# Patient Record
Sex: Male | Born: 1985 | Race: White | Hispanic: Yes | Marital: Single | Smoking: Current some day smoker
Health system: Southern US, Community
[De-identification: ages and names within clinical notes are randomized; demographics above are authoritative.]

## PROBLEM LIST (undated history)

## (undated) DIAGNOSIS — K859 Acute pancreatitis without necrosis or infection, unspecified: Secondary | ICD-10-CM

---

## 2006-11-12 ENCOUNTER — Emergency Department (HOSPITAL_COMMUNITY): Admission: EM | Admit: 2006-11-12 | Discharge: 2006-11-12 | Payer: Self-pay | Admitting: Family Medicine

## 2007-01-28 ENCOUNTER — Emergency Department (HOSPITAL_COMMUNITY): Admission: EM | Admit: 2007-01-28 | Discharge: 2007-01-28 | Payer: Self-pay | Admitting: Emergency Medicine

## 2007-01-30 ENCOUNTER — Emergency Department (HOSPITAL_COMMUNITY): Admission: EM | Admit: 2007-01-30 | Discharge: 2007-01-31 | Payer: Self-pay | Admitting: Emergency Medicine

## 2007-04-21 ENCOUNTER — Emergency Department (HOSPITAL_COMMUNITY): Admission: EM | Admit: 2007-04-21 | Discharge: 2007-04-22 | Payer: Self-pay | Admitting: Emergency Medicine

## 2008-01-01 ENCOUNTER — Emergency Department (HOSPITAL_COMMUNITY): Admission: EM | Admit: 2008-01-01 | Discharge: 2008-01-01 | Payer: Self-pay | Admitting: Emergency Medicine
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
## 2010-10-10 ENCOUNTER — Inpatient Hospital Stay (INDEPENDENT_AMBULATORY_CARE_PROVIDER_SITE_OTHER)
Admission: RE | Admit: 2010-10-10 | Discharge: 2010-10-10 | Disposition: A | Discharge status: Home / Self Care | Payer: Self-pay | Source: Ambulatory Visit | Attending: Family Medicine | Admitting: Family Medicine

## 2010-10-10 DIAGNOSIS — H9209 Otalgia, unspecified ear: Secondary | ICD-10-CM

## 2010-10-10 DIAGNOSIS — R300 Dysuria: Secondary | ICD-10-CM

## 2010-10-10 DIAGNOSIS — M545 Low back pain: Secondary | ICD-10-CM

## 2010-10-10 LAB — POCT URINALYSIS DIP (DEVICE)
Nitrite: NEGATIVE
Urobilinogen, UA: 0.2 mg/dL (ref 0.0–1.0)
pH: 6.5 (ref 5.0–8.0)

## 2010-10-12 LAB — URINE CULTURE: Culture  Setup Time: 201204060520

## 2011-04-03 LAB — POCT I-STAT, CHEM 8
BUN: 12
Chloride: 108
HCT: 45
Sodium: 143
TCO2: 21

## 2011-04-16 LAB — CBC
HCT: 39.4
Platelets: 251
WBC: 8.5

## 2011-04-16 LAB — DIFFERENTIAL
Basophils Absolute: 0.1
Basophils Relative: 1
Eosinophils Relative: 3
Monocytes Absolute: 0.7
Monocytes Relative: 9

## 2011-04-16 LAB — COMPREHENSIVE METABOLIC PANEL
ALT: 57 — ABNORMAL HIGH
AST: 36
Albumin: 3.8
Alkaline Phosphatase: 62
Chloride: 101
GFR calc Af Amer: >60
Potassium: 4.1
Sodium: 138
Total Bilirubin: 0.7
Total Protein: 6.4

## 2011-04-16 LAB — URINALYSIS, ROUTINE W REFLEX MICROSCOPIC
Glucose, UA: NEGATIVE
Nitrite: NEGATIVE
Protein, ur: NEGATIVE
Urobilinogen, UA: 0.2

## 2011-04-21 LAB — DIFFERENTIAL
Basophils Absolute: 0
Basophils Absolute: 0
Basophils Relative: 1
Eosinophils Absolute: 0.3
Eosinophils Relative: 5
Lymphocytes Relative: 32
Lymphocytes Relative: 38
Lymphs Abs: 2.7
Lymphs Abs: 2.6
Monocytes Absolute: 0.6
Monocytes Relative: 9
Neutro Abs: 4.7
Neutro Abs: 3.5
Neutrophils Relative %: 48

## 2011-04-21 LAB — COMPREHENSIVE METABOLIC PANEL WITH GFR
AST: 21
Albumin: 3.8
Calcium: 8.9
Creatinine, Ser: 0.87
GFR calc Af Amer: >60
GFR calc non Af Amer: >60

## 2011-04-21 LAB — CBC
HCT: 39.4
HCT: 39
Hemoglobin: 13.4
MCHC: 33.4
MCHC: 34.4
MCV: 80.1
MCV: 79.5
Platelets: 277
Platelets: 263
RBC: 4.92
RBC: 4.9
RDW: 13.4
WBC: 8.4
WBC: 7

## 2011-04-21 LAB — COMPREHENSIVE METABOLIC PANEL
ALT: 24
AST: 20
Alkaline Phosphatase: 73
BUN: 9
BUN: 8
CO2: 29
CO2: 28
Calcium: 8.9
Chloride: 105
Chloride: 102
Creatinine, Ser: 0.95
GFR calc Af Amer: >60
GFR calc non Af Amer: >60
Glucose, Bld: 117 — ABNORMAL HIGH
Potassium: 3.8
Sodium: 137
Total Bilirubin: 0.7
Total Bilirubin: 1.3 — ABNORMAL HIGH
Total Protein: 6.6

## 2011-04-21 LAB — URINALYSIS, ROUTINE W REFLEX MICROSCOPIC
Bilirubin Urine: NEGATIVE
Bilirubin Urine: NEGATIVE
Glucose, UA: NEGATIVE
Glucose, UA: NEGATIVE
Hgb urine dipstick: NEGATIVE
Hgb urine dipstick: NEGATIVE
Ketones, ur: NEGATIVE
Nitrite: NEGATIVE
Protein, ur: NEGATIVE
Protein, ur: NEGATIVE
Specific Gravity, Urine: 1.023
Urobilinogen, UA: 0.2
Urobilinogen, UA: 0.2
pH: 6

## 2011-04-21 LAB — LIPASE, BLOOD
Lipase: 18
Lipase: 18

## 2013-07-08 ENCOUNTER — Encounter (HOSPITAL_COMMUNITY): Payer: Self-pay | Admitting: Emergency Medicine

## 2013-07-08 DIAGNOSIS — E860 Dehydration: Secondary | ICD-10-CM | POA: Insufficient documentation

## 2013-07-08 DIAGNOSIS — K2990 Gastroduodenitis, unspecified, without bleeding: Principal | ICD-10-CM

## 2013-07-08 DIAGNOSIS — K2970 Gastritis, unspecified, without bleeding: Secondary | ICD-10-CM | POA: Insufficient documentation

## 2013-07-08 DIAGNOSIS — F17200 Nicotine dependence, unspecified, uncomplicated: Secondary | ICD-10-CM | POA: Insufficient documentation

## 2013-07-08 MED ORDER — ONDANSETRON 4 MG PO TBDP
8.0000 mg | ORAL_TABLET | Freq: Once | ORAL | Status: AC
  Administered 2013-07-08: 8 mg via ORAL
  Filled 2013-07-08: qty 2

## 2013-07-08 MED ORDER — OXYCODONE-ACETAMINOPHEN 5-325 MG PO TABS
1.0000 | ORAL_TABLET | Freq: Once | ORAL | Status: AC
  Administered 2013-07-08: 1 via ORAL
  Filled 2013-07-08: qty 1

## 2013-07-08 NOTE — ED Notes (Signed)
The pt has had generalized abd pain for 3 days  With increasing pain and vomiting tonight with diarrhea

## 2013-07-09 ENCOUNTER — Emergency Department (HOSPITAL_COMMUNITY)
Admission: EM | Admit: 2013-07-09 | Discharge: 2013-07-09 | Disposition: A | Discharge status: Home / Self Care | Payer: Self-pay | Attending: Emergency Medicine | Admitting: Emergency Medicine

## 2013-07-09 DIAGNOSIS — E860 Dehydration: Secondary | ICD-10-CM

## 2013-07-09 DIAGNOSIS — K2970 Gastritis, unspecified, without bleeding: Secondary | ICD-10-CM

## 2013-07-09 HISTORY — DX: Acute pancreatitis without necrosis or infection, unspecified: K85.90

## 2013-07-09 LAB — URINE MICROSCOPIC-ADD ON

## 2013-07-09 LAB — COMPREHENSIVE METABOLIC PANEL
ALT: 34 U/L (ref 0–53)
AST: 20 U/L (ref 0–37)
Albumin: 4.1 g/dL (ref 3.5–5.2)
Alkaline Phosphatase: 69 U/L (ref 39–117)
BUN: 14 mg/dL (ref 6–23)
CALCIUM: 9.2 mg/dL (ref 8.4–10.5)
CO2: 28 meq/L (ref 19–32)
Chloride: 99 mEq/L (ref 96–112)
Creatinine, Ser: 0.79 mg/dL (ref 0.50–1.35)
GFR calc Af Amer: >90 mL/min (ref >90)
GFR calc non Af Amer: >90 mL/min (ref >90)
Glucose, Bld: 116 mg/dL — ABNORMAL HIGH (ref 70–99)
Potassium: 3.8 mEq/L (ref 3.7–5.3)
SODIUM: 141 meq/L (ref 137–147)
TOTAL PROTEIN: 7.9 g/dL (ref 6.0–8.3)
Total Bilirubin: 0.4 mg/dL (ref 0.3–1.2)

## 2013-07-09 LAB — CBC WITH DIFFERENTIAL/PLATELET
BASOS ABS: 0 10*3/uL (ref 0.0–0.1)
Basophils Relative: 0 % (ref 0–1)
EOS ABS: 0.2 10*3/uL (ref 0.0–0.7)
EOS PCT: 2 % (ref 0–5)
HCT: 44.3 % (ref 39.0–52.0)
Hemoglobin: 15.2 g/dL (ref 13.0–17.0)
LYMPHS PCT: 39 % (ref 12–46)
Lymphs Abs: 3.8 10*3/uL (ref 0.7–4.0)
MCH: 27 pg (ref 26.0–34.0)
MCHC: 34.3 g/dL (ref 30.0–36.0)
MCV: 78.8 fL (ref 78.0–100.0)
Monocytes Absolute: 0.6 10*3/uL (ref 0.1–1.0)
Monocytes Relative: 7 % (ref 3–12)
NEUTROS PCT: 52 % (ref 43–77)
Neutro Abs: 5 10*3/uL (ref 1.7–7.7)
PLATELETS: 304 10*3/uL (ref 150–400)
RBC: 5.62 MIL/uL (ref 4.22–5.81)
RDW: 12.9 % (ref 11.5–15.5)
WBC: 9.6 10*3/uL (ref 4.0–10.5)

## 2013-07-09 LAB — URINALYSIS, ROUTINE W REFLEX MICROSCOPIC
Bilirubin Urine: NEGATIVE
GLUCOSE, UA: NEGATIVE mg/dL
HGB URINE DIPSTICK: NEGATIVE
Ketones, ur: 15 mg/dL — AB
Leukocytes, UA: NEGATIVE
Nitrite: NEGATIVE
Protein, ur: 30 mg/dL — AB
SPECIFIC GRAVITY, URINE: 1.026 (ref 1.005–1.030)
Urobilinogen, UA: 1 mg/dL (ref 0.0–1.0)
pH: 7 (ref 5.0–8.0)

## 2013-07-09 LAB — LIPASE, BLOOD: Lipase: 45 U/L (ref 11–59)

## 2013-07-09 MED ORDER — SODIUM CHLORIDE 0.9 % IV BOLUS (SEPSIS)
1000.0000 mL | Freq: Once | INTRAVENOUS | Status: AC
  Administered 2013-07-09: 1000 mL via INTRAVENOUS

## 2013-07-09 MED ORDER — OMEPRAZOLE 20 MG PO CPDR: 20.0000 mg | DELAYED_RELEASE_CAPSULE | Freq: Every day | ORAL | Status: AC

## 2013-07-09 MED ORDER — FAMOTIDINE 20 MG PO TABS: 20.0000 mg | ORAL_TABLET | Freq: Two times a day (BID) | ORAL | Status: AC

## 2013-07-09 MED ORDER — MORPHINE SULFATE 4 MG/ML IJ SOLN
4.0000 mg | Freq: Once | INTRAMUSCULAR | Status: AC
  Administered 2013-07-09: 4 mg via INTRAVENOUS
  Filled 2013-07-09: qty 1

## 2013-07-09 MED ORDER — FAMOTIDINE IN NACL 20-0.9 MG/50ML-% IV SOLN
20.0000 mg | Freq: Once | INTRAVENOUS | Status: AC
  Administered 2013-07-09: 20 mg via INTRAVENOUS
  Filled 2013-07-09: qty 50

## 2013-07-09 MED ORDER — ONDANSETRON HCL 4 MG/2ML IJ SOLN
4.0000 mg | Freq: Once | INTRAMUSCULAR | Status: AC
  Administered 2013-07-09: 4 mg via INTRAVENOUS
  Filled 2013-07-09: qty 2

## 2013-07-09 NOTE — Discharge Instructions (Signed)
Stay hydrated.   Take tylenol for pain.   Take pepcid and prilosec as prescribed.   Follow up with your doctor.   Return to ER if you have severe pain, vomiting, dehydration.

## 2013-07-09 NOTE — ED Notes (Signed)
Pt requested water to drink. ok'd by Dr Silverio LayYao who suggested crackers also. Pt refused the crackers. Pt given water.

## 2013-07-09 NOTE — ED Provider Notes (Signed)
CSN: [id number]     Arrival date & time 07/08/13  2327 History   First MD Initiated Contact with Patient 07/09/13 0129     Chief Complaint  Patient presents with  . Abdominal Pain   (Consider location/radiation/quality/duration/timing/severity/associated sxs/prior Treatment) The history is provided by the patient.  Jake Terry is a 28 y.o. male hx of pancreatitis, reflux, gastritis, here with vomiting and abdominal pain. Generalized abdominal pain for last 3 days. Some nausea and vomiting today. Also some diarrhea as well. Denies any fevers or chills. As per the patient he frequently has these symptoms.    Past Medical History  Diagnosis Date  . Pancreatitis    History reviewed. No pertinent past surgical history. No family history on file. History  Substance Use Topics  . Smoking status: Current Every Day Smoker  . Smokeless tobacco: Not on file  . Alcohol Use: Yes    Review of Systems  Gastrointestinal: Positive for vomiting and abdominal pain.  All other systems reviewed and are negative.    Allergies  Review of patient's allergies indicates no known allergies.  Home Medications  No current outpatient prescriptions on file. BP 134/89  Pulse 80  Temp(Src) 98.3 F (36.8 C) (Oral)  Resp 20  Wt 198 lb 8 oz (90.039 kg)  SpO2 99% Physical Exam  Nursing note and vitals reviewed. Constitutional: He is oriented to person, place, and time. He appears well-developed and well-nourished.  HENT:  Head: Normocephalic.  MM slightly dry   Eyes: Conjunctivae are normal. Pupils are equal, round, and reactive to light.  Neck: Normal range of motion. Neck supple.  Cardiovascular: Normal rate, regular rhythm and normal heart sounds.   Pulmonary/Chest: Effort normal and breath sounds normal. No respiratory distress. He has no wheezes. He has no rales.  Abdominal: Soft. Bowel sounds are normal.  Mild epigastric tenderness, no RUQ tenderness   Musculoskeletal: Normal range of  motion.  Neurological: He is alert and oriented to person, place, and time.  Skin: Skin is warm and dry.  Psychiatric: He has a normal mood and affect. His behavior is normal. Judgment and thought content normal.    ED Course  Procedures (including critical care time) Labs Review Labs Reviewed  COMPREHENSIVE METABOLIC PANEL - Abnormal; Notable for the following:    Glucose, Bld 116 (*)    All other components within normal limits  URINALYSIS, ROUTINE W REFLEX MICROSCOPIC - Abnormal; Notable for the following:    Ketones, ur 15 (*)    Protein, ur 30 (*)    All other components within normal limits  CBC WITH DIFFERENTIAL  LIPASE, BLOOD  URINE MICROSCOPIC-ADD ON   Imaging Review No results found.  EKG Interpretation   None       MDM  No diagnosis found. Jake Terry is a 28 y.o. male here with ab pain, vomiting. Slightly dehydrated. LFTs nl, lipase nl. Patient hydrated in the ED and given pepcid. Likely worsening gastritis. Will d/c home on prilosec and pepcid and zofran.      Richardean Canalavid H Yao, MD 07/09/13 [phone]

## 2018-12-21 ENCOUNTER — Emergency Department (HOSPITAL_COMMUNITY)
Admission: EM | Admit: 2018-12-21 | Discharge: 2018-12-21 | Disposition: A | Discharge status: Home / Self Care | Payer: Self-pay | Attending: Emergency Medicine | Admitting: Emergency Medicine

## 2018-12-21 ENCOUNTER — Encounter (HOSPITAL_COMMUNITY): Payer: Self-pay

## 2018-12-21 ENCOUNTER — Emergency Department (HOSPITAL_COMMUNITY): Payer: Self-pay

## 2018-12-21 ENCOUNTER — Encounter (HOSPITAL_COMMUNITY): Payer: Self-pay | Admitting: Emergency Medicine

## 2018-12-21 ENCOUNTER — Other Ambulatory Visit: Payer: Self-pay

## 2018-12-21 ENCOUNTER — Ambulatory Visit (HOSPITAL_COMMUNITY)
Admission: EM | Admit: 2018-12-21 | Discharge: 2018-12-21 | Disposition: A | Discharge status: Home / Self Care | Payer: Self-pay

## 2018-12-21 DIAGNOSIS — R110 Nausea: Secondary | ICD-10-CM | POA: Insufficient documentation

## 2018-12-21 DIAGNOSIS — F17210 Nicotine dependence, cigarettes, uncomplicated: Secondary | ICD-10-CM | POA: Insufficient documentation

## 2018-12-21 DIAGNOSIS — Z79899 Other long term (current) drug therapy: Secondary | ICD-10-CM | POA: Insufficient documentation

## 2018-12-21 DIAGNOSIS — K861 Other chronic pancreatitis: Secondary | ICD-10-CM | POA: Insufficient documentation

## 2018-12-21 DIAGNOSIS — R1011 Right upper quadrant pain: Secondary | ICD-10-CM

## 2018-12-21 LAB — CBC WITH DIFFERENTIAL/PLATELET
Abs Immature Granulocytes: 0.02 10*3/uL (ref 0.00–0.07)
Basophils Absolute: 0 10*3/uL (ref 0.0–0.1)
Basophils Relative: 1 %
Eosinophils Absolute: 0.1 10*3/uL (ref 0.0–0.5)
Eosinophils Relative: 2 %
HCT: 41.9 % (ref 39.0–52.0)
Hemoglobin: 13.7 g/dL (ref 13.0–17.0)
Immature Granulocytes: 0 %
Lymphocytes Relative: 29 %
Lymphs Abs: 2 10*3/uL (ref 0.7–4.0)
MCH: 26.8 pg (ref 26.0–34.0)
MCHC: 32.7 g/dL (ref 30.0–36.0)
MCV: 81.8 fL (ref 80.0–100.0)
Monocytes Absolute: 0.6 10*3/uL (ref 0.1–1.0)
Monocytes Relative: 9 %
Neutro Abs: 4.1 10*3/uL (ref 1.7–7.7)
Neutrophils Relative %: 59 %
Platelets: 249 10*3/uL (ref 150–400)
RBC: 5.12 MIL/uL (ref 4.22–5.81)
RDW: 13.3 % (ref 11.5–15.5)
WBC: 7 10*3/uL (ref 4.0–10.5)
nRBC: 0 % (ref 0.0–0.2)

## 2018-12-21 LAB — COMPREHENSIVE METABOLIC PANEL
ALT: 21 U/L (ref 0–44)
AST: 15 U/L (ref 15–41)
Albumin: 3.9 g/dL (ref 3.5–5.0)
Alkaline Phosphatase: 53 U/L (ref 38–126)
Anion gap: 11 (ref 5–15)
BUN: 13 mg/dL (ref 6–20)
CO2: 23 mmol/L (ref 22–32)
Calcium: 9.4 mg/dL (ref 8.9–10.3)
Chloride: 104 mmol/L (ref 98–111)
Creatinine, Ser: 0.92 mg/dL (ref 0.61–1.24)
GFR calc Af Amer: >60 mL/min (ref >60)
GFR calc non Af Amer: >60 mL/min (ref >60)
Glucose, Bld: 157 mg/dL — ABNORMAL HIGH (ref 70–99)
Potassium: 4 mmol/L (ref 3.5–5.1)
Sodium: 138 mmol/L (ref 135–145)
Total Bilirubin: 0.6 mg/dL (ref 0.3–1.2)
Total Protein: 6.9 g/dL (ref 6.5–8.1)

## 2018-12-21 LAB — LIPASE, BLOOD: Lipase: 63 U/L — ABNORMAL HIGH (ref 11–51)

## 2018-12-21 MED ORDER — MORPHINE SULFATE (PF) 4 MG/ML IV SOLN
4.0000 mg | Freq: Once | INTRAVENOUS | Status: AC
  Administered 2018-12-21: 4 mg via INTRAVENOUS
  Filled 2018-12-21: qty 1

## 2018-12-21 MED ORDER — ONDANSETRON HCL 4 MG/2ML IJ SOLN
4.0000 mg | Freq: Once | INTRAMUSCULAR | Status: AC
  Administered 2018-12-21: 4 mg via INTRAVENOUS
  Filled 2018-12-21: qty 2

## 2018-12-21 NOTE — Discharge Instructions (Addendum)
33 y.o. male with h/o GERD here for RUQ pain x 5 days.   Denies h/o cholecystectomy, liver disease. Has chronic, intermittent h/o biliary colic, though now this has become constant over the last 5 days. Patient is hemodynamically stable, afebrile, and w/o jaundice, though in severe pain in RUQ w/ positive Murphy's sign. Referred patient to ER for further evaluation for suspected cholecystitis (RUQ [place]).

## 2018-12-21 NOTE — ED Provider Notes (Signed)
[facility] Provider Note   CSN: [id number] Arrival date & time: 12/21/18  1239     History   Chief Complaint Chief Complaint  Patient presents with  . Abdominal Pain    HPI Han P Ethelle LyonOcampo is a 33 y.o. male.     HPI  33 year old male with a PMH of pancreatitis, presents with right upper quadrant pain for the last 5 days.  He does note approximately 15 days ago he had 6 beers but has not had anything to drink since then.  He notes pain is worse after eating foods.  He notes some mild nausea.  He denies vomiting, diarrhea, constipation, fevers, chills.  Denies any chest pain or shortness of breath.  He denies a history of abdominal surgeries.  Past Medical History:  Diagnosis Date  . Pancreatitis     There are no active problems to display for this patient.   History reviewed. No pertinent surgical history.      Home Medications    Prior to Admission medications   Medication Sig Start Date End Date Taking? Authorizing Provider  famotidine (PEPCID) 20 MG tablet Take 1 tablet (20 mg total) by mouth 2 (two) times daily. 07/09/13   Charlynne PanderYao, David Hsienta, MD  omeprazole (PRILOSEC) 20 MG capsule Take 1 capsule (20 mg total) by mouth daily. 07/09/13   Charlynne PanderYao, David Hsienta, MD    Family History Family History  Problem Relation Age of Onset  . Healthy Mother   . Healthy Father     Social History Social History   Tobacco Use  . Smoking status: Current Some Day Smoker    Types: Cigarettes  . Smokeless tobacco: Never Used  Substance Use Topics  . Alcohol use: Yes  . Drug use: Not on file     Allergies   Patient has no known allergies.   Review of Systems Review of Systems  Constitutional: Negative for chills and fever.  Respiratory: Negative for shortness of breath.   Cardiovascular: Negative for chest pain.  Gastrointestinal: Positive for abdominal pain and nausea. Negative for vomiting.  Genitourinary: Negative for  dysuria.  All other systems reviewed and are negative.    Physical Exam Updated Vital Signs BP (!) 142/86 (BP Location: Right Arm)   Pulse 72   Temp 98.7 F (37.1 C) (Oral)   Resp 16   SpO2 99%   Physical Exam Vitals signs and nursing note reviewed.  Constitutional:      Appearance: He is well-developed.  HENT:     Head: Normocephalic and atraumatic.  Eyes:     Conjunctiva/sclera: Conjunctivae normal.  Neck:     Musculoskeletal: Neck supple.  Cardiovascular:     Rate and Rhythm: Normal rate and regular rhythm.     Heart sounds: Normal heart sounds. No murmur.  Pulmonary:     Effort: Pulmonary effort is normal. No respiratory distress.     Breath sounds: Normal breath sounds. No wheezing or rales.  Abdominal:     General: Bowel sounds are normal. There is no distension.     Palpations: Abdomen is soft.     Tenderness: There is abdominal tenderness in the right upper quadrant. There is no guarding or rebound. Positive signs include Murphy's sign. Negative signs include McBurney's sign.  Musculoskeletal: Normal range of motion.        General: No tenderness or deformity.  Skin:    General: Skin is warm and dry.     Findings: No erythema or  rash.  Neurological:     Mental Status: He is alert and oriented to person, place, and time.  Psychiatric:        Behavior: Behavior normal.      ED Treatments / Results  Labs (all labs ordered are listed, but only abnormal results are displayed) Labs Reviewed  CBC WITH DIFFERENTIAL/PLATELET  COMPREHENSIVE METABOLIC PANEL  LIPASE, BLOOD    EKG    Radiology No results found.  Procedures Procedures (including critical care time)  Medications Ordered in ED Medications  morphine 4 MG/ML injection 4 mg (has no administration in time range)  ondansetron (ZOFRAN) injection 4 mg (has no administration in time range)     Initial Impression / Assessment and Plan / ED Course  I have reviewed the triage vital signs and the  nursing notes.  Pertinent labs & imaging results that were available during my care of the patient were reviewed by me and considered in my medical decision making (see chart for details).        Presented with right upper quadrant pain he has had this intermittently for 5 days, worse with eating food.  He has a history of pancreatitis.  He denies epigastric pain.  He denies any vomiting.  He was given morphine and Zofran.  He had a right upper quadrant ultrasound which shows no evidence of acute cholecystitis.  His blood work is reassuring, no elevation in the white count.  His kidney function is stable, no significant electrolyte abnormality.  Lipase is slightly elevated however do not feel that this is pancreatitis as it is only minimally elevated.  On reevaluation patient has no epigastric pain.  He is feeling significantly better.  History and physical most consistent with biliary colic.  Patient encouraged to follow-up with primary care for general surgeon.  He did have fatty liver on ultrasound but LFTs are normal, patient is not jaundiced.  Now resting comfortably in bed, no acute distress, nontoxic, non-lethargic.  Vital signs stable.  Given strict return precautions and expressed understanding.  He is ready and stable for discharge.  Final Clinical Impressions(s) / ED Diagnoses   Final diagnoses:  RUQ pain    ED Discharge Orders    None       Rachel Moulds 12/21/18 2006    Daleen Bo, MD 12/22/18 [phone]

## 2018-12-21 NOTE — ED Notes (Signed)
Pt. Transported to [place]

## 2018-12-21 NOTE — ED Provider Notes (Signed)
[facility]    CSN: [id number] Arrival date & time: 12/21/18  1058     History   Chief Complaint Chief Complaint  Patient presents with  . Abdominal Pain    HPI Thamas P Ethelle LyonOcampo is a 33 y.o. male with a history of pancreatitis presenting for right upper quadrant pain.  Patient states that he has had "a couple years now" where he has had intermittent right-sided pain with associated nausea.  Patient is unable to articulate what types of foods exacerbate this.  Patient feels that it is "almost anything to eat ".  Patient states over the last 5 days his pain has turned from intermittent colicky to constant.  Patient states that he has had some nausea, no vomiting or diarrhea.  Patient does admit to alcohol use, "only on the weekends "and denies recent binge drinking.  Patient denies history of abdominal surgeries.  States that the only thing helps with his pain is Tylenol: Has been taking 1 tab twice daily, though notes "the pain comes right back ".  Patient denies fever, recent illness, chest pain, shortness of breath, hematochezia, melena, hematuria.   Past Medical History:  Diagnosis Date  . Pancreatitis     There are no active problems to display for this patient.   History reviewed. No pertinent surgical history.     Home Medications    Prior to Admission medications   Medication Sig Start Date End Date Taking? Authorizing Provider  famotidine (PEPCID) 20 MG tablet Take 1 tablet (20 mg total) by mouth 2 (two) times daily. 07/09/13   Charlynne PanderYao, David Hsienta, MD  omeprazole (PRILOSEC) 20 MG capsule Take 1 capsule (20 mg total) by mouth daily. 07/09/13   Charlynne PanderYao, David Hsienta, MD    Family History Family History  Problem Relation Age of Onset  . Healthy Mother   . Healthy Father     Social History Social History   Tobacco Use  . Smoking status: Current Some Day Smoker    Types: Cigarettes  . Smokeless tobacco: Never Used  Substance Use Topics  . Alcohol use:  Yes  . Drug use: Not on file     Allergies   Patient has no known allergies.   Review of Systems As per HPI   Physical Exam Triage Vital Signs ED Triage Vitals  Enc Vitals Group     BP 12/21/18 1142 136/80     Pulse Rate 12/21/18 1142 75     Resp 12/21/18 1142 17     Temp 12/21/18 1142 98.2 F (36.8 C)     Temp Source 12/21/18 1142 Oral     SpO2 12/21/18 1142 99 %     Weight --      Height --      Head Circumference --      Peak Flow --      Pain Score 12/21/18 1140 5     Pain Loc --      Pain Edu? --      Excl. in GC? --    No data found.  Updated Vital Signs BP 136/80 (BP Location: Right Arm)   Pulse 75   Temp 98.2 F (36.8 C) (Oral)   Resp 17   SpO2 99%   Visual Acuity Right Eye Distance:   Left Eye Distance:   Bilateral Distance:    Right Eye Near:   Left Eye Near:    Bilateral Near:     Physical Exam Constitutional:  General: He is not in acute distress. HENT:     Head: Normocephalic and atraumatic.  Eyes:     General: No scleral icterus.    Pupils: Pupils are equal, round, and reactive to light.  Cardiovascular:     Rate and Rhythm: Normal rate.  Pulmonary:     Effort: Pulmonary effort is normal.  Abdominal:     General: Bowel sounds are normal. There is no distension or abdominal bruit. There are no signs of injury.     Palpations: Abdomen is soft. There is no shifting dullness, hepatomegaly or splenomegaly.     Tenderness: There is abdominal tenderness in the right upper quadrant. There is no right CVA tenderness, left CVA tenderness or guarding. Positive signs include Murphy's sign. Negative signs include McBurney's sign.  Skin:    Coloration: Skin is not jaundiced or pale.  Neurological:     Mental Status: He is alert and oriented to person, place, and time.      UC Treatments / Results  Labs (all labs ordered are listed, but only abnormal results are displayed) Labs Reviewed - No data to display  EKG None  Radiology    Procedures Procedures (including critical care time)  Medications Ordered in UC Medications - No data to display  Initial Impression / Assessment and Plan / UC Course  I have reviewed the triage vital signs and the nursing notes.  Pertinent labs & imaging results that were available during my care of the patient were reviewed by me and considered in my medical decision making (see chart for details).     33 year old male with a right history of pancreatitis presenting for right upper quadrant pain that has worsened over the last 5 days.  Patient denying heavy consumption of alcohol, change in medications.  Also endorsing nausea and history of chronic intermittent biliary colic.  Concerning for possible acute cholecystitis versus possible pancreatitis, referred to ER for further evaluation. Final Clinical Impressions(s) / UC Diagnoses   Final diagnoses:  RUQ pain     Discharge Instructions     33 y.o. male with h/o GERD here for RUQ pain x 5 days.   Denies h/o cholecystectomy, liver disease. Has chronic, intermittent h/o biliary colic, though now this has become constant over the last 5 days. Patient is hemodynamically stable, afebrile, and w/o jaundice, though in severe pain in RUQ w/ positive Murphy's sign. Referred patient to ER for further evaluation for suspected cholecystitis (RUQ [place]).    ED Prescriptions    None     Controlled Substance Prescriptions [place] Controlled Substance Registry consulted? Not Applicable   Quincy Sheehan, [place] 12/21/18 2206

## 2018-12-21 NOTE — Discharge Instructions (Signed)
Avoid fatty and greasy food.  Drink plenty of fluids.  Eat a bland diet for the next 1 to 2 days and then advance diet as tolerated.  Follow-up with your primary care provider for continued evaluation.  Follow-up with a general surgeon regarding your gallbladder as needed.  Return to the ED immediately for new or worsening symptoms or concerns, such as return of your abdominal pain, vomiting, fevers or any concerns.

## 2018-12-21 NOTE — ED Triage Notes (Signed)
Patient presents to Urgent Care with complaints of abdominal pain since 5 days ago. Patient reports he has not had nausea/vomiting/diarrhea. Pt has been taking tylenol, none today. Pt states he had a normal BM at 0500 this morning.

## 2018-12-21 NOTE — ED Triage Notes (Signed)
Pt arrives to ED from UC with complaints RUQ abdominal pain for the last five days. Pt was referred to the ED for abdominal US.

## 2018-12-21 NOTE — ED Notes (Signed)
Patient verbalizes understanding of discharge instructions. Opportunity for questioning and answers were provided. Armband removed by staff, pt discharged from ED.  

## 2021-04-28 IMAGING — US ULTRASOUND ABDOMEN LIMITED
1 series · 13 of 25 positions shown · non-contrast
Comparison: None.

CLINICAL DATA: Right upper quadrant abdominal pain

EXAM:
ULTRASOUND ABDOMEN LIMITED RIGHT UPPER QUADRANT

[Series 1: ultrasound abdomen limited · 13 of 42 slices shown]
[im 1/42]
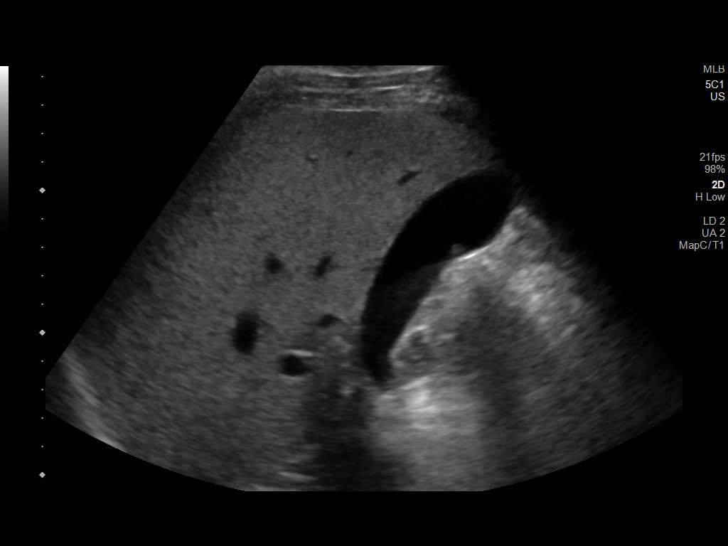
[im 4/42]
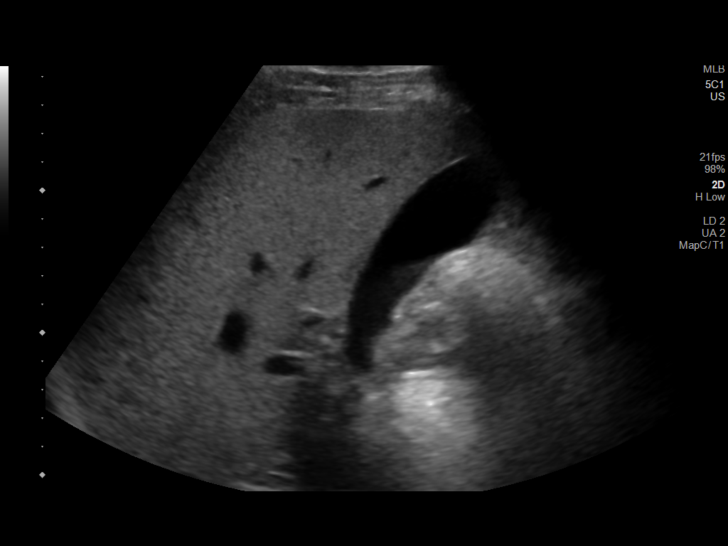
[im 7/42]
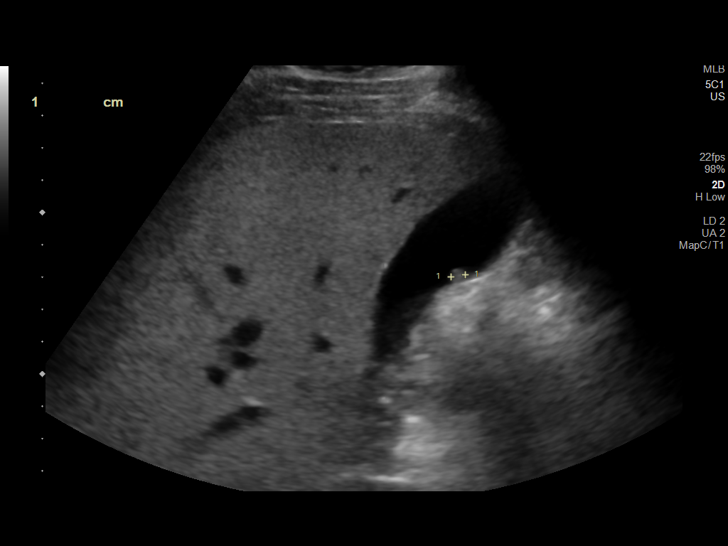
[im 11/42]
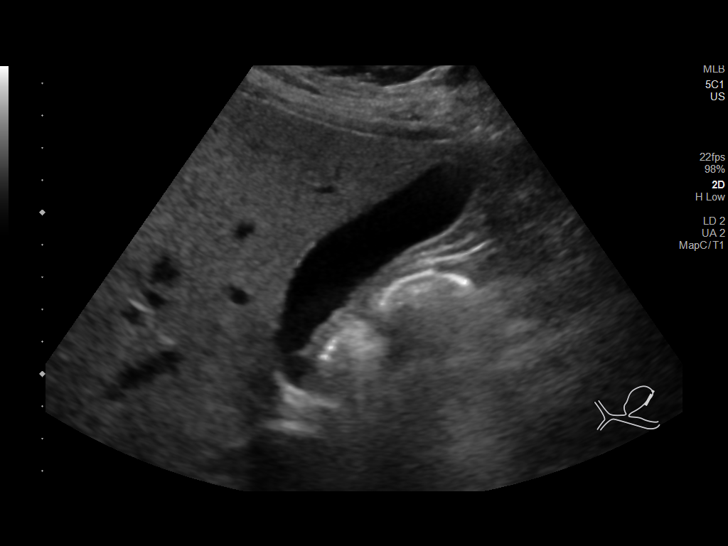
[im 14/42]
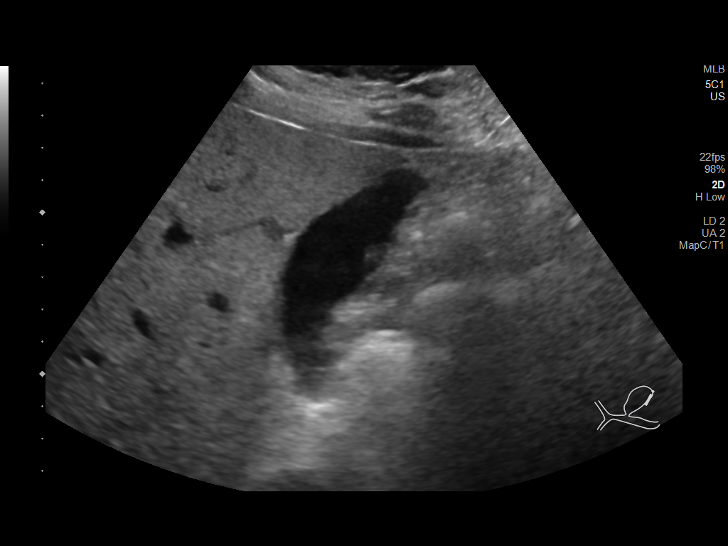
[im 18/42]
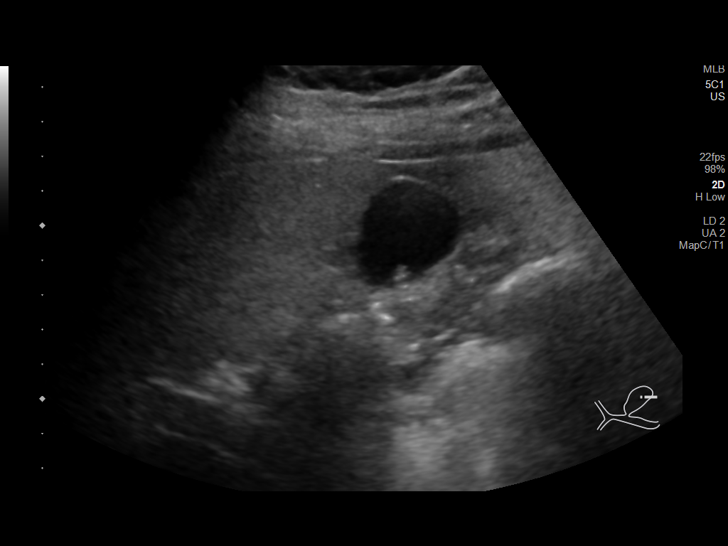
[im 21/42]
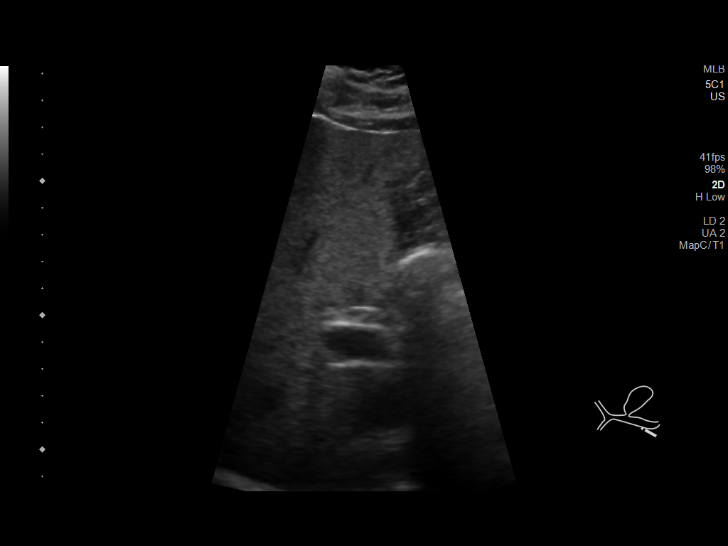
[im 24/42]
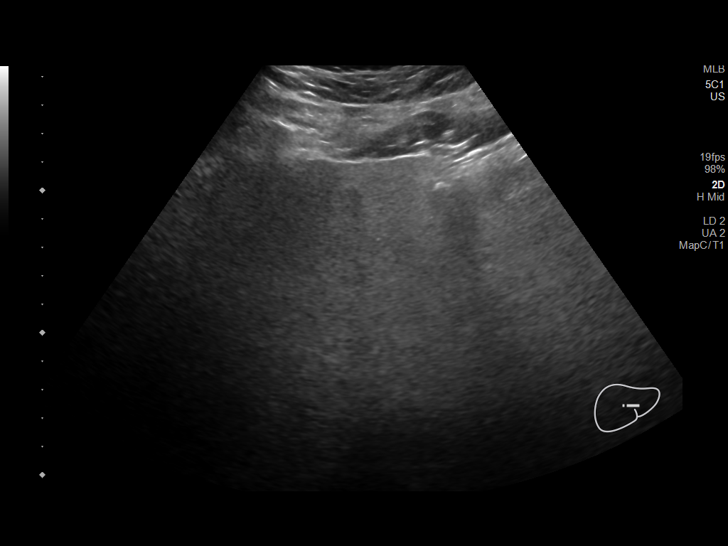
[im 28/42]
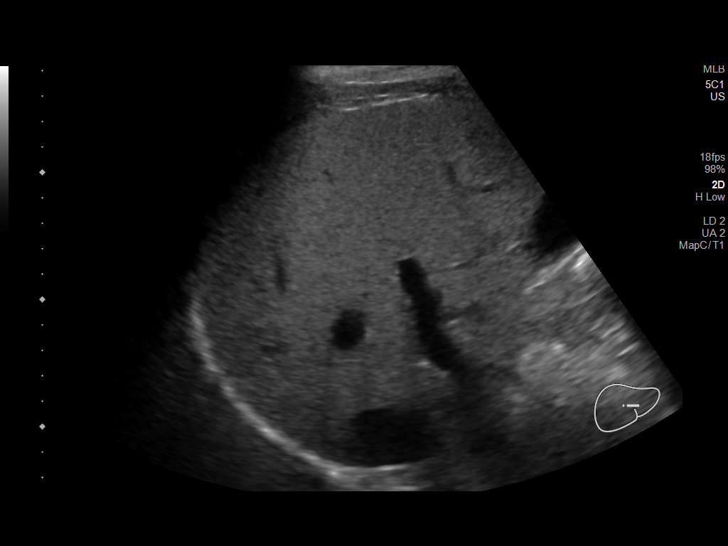
[im 31/42]
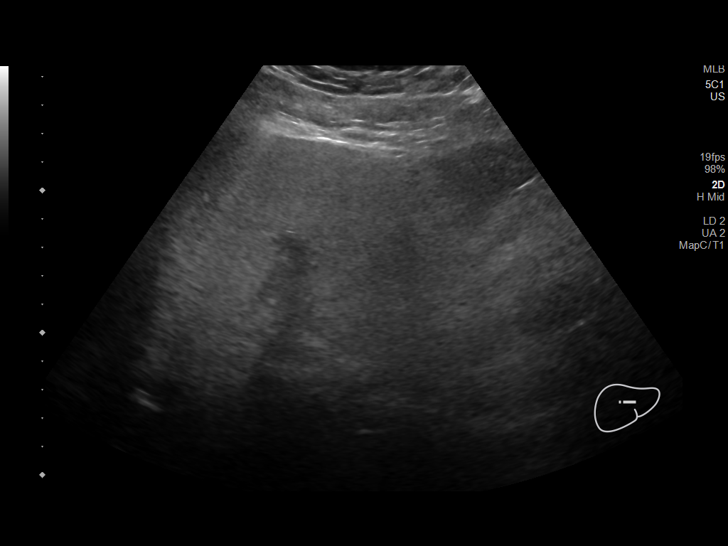
[im 35/42]
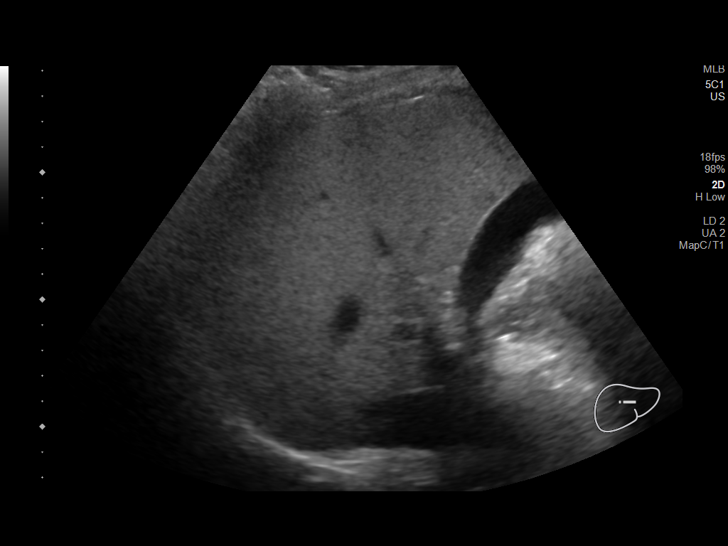
[im 38/42]
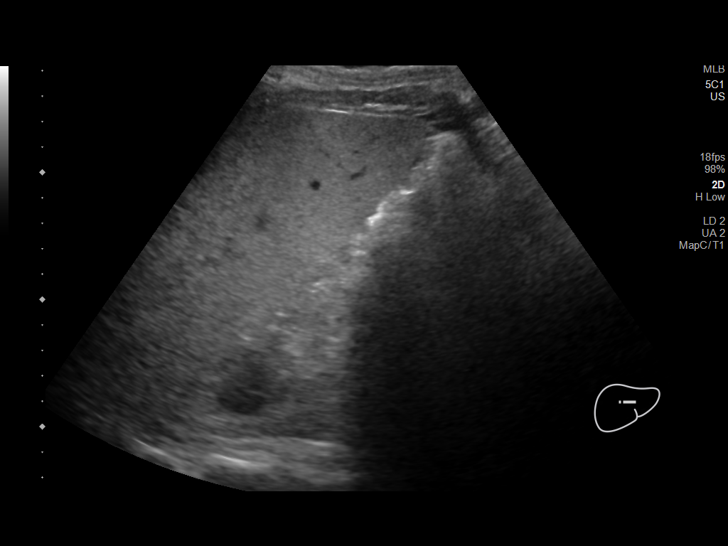
[im 42/42]
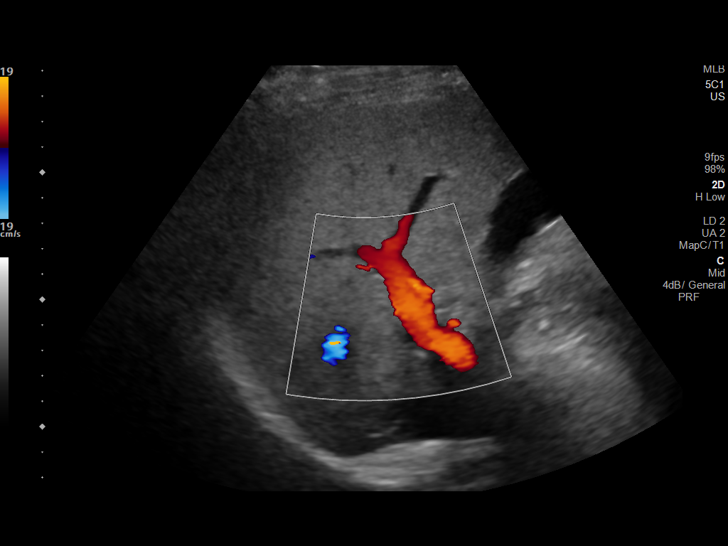

[13 of 25 positions shown; findings below may reference images not displayed]

FINDINGS: Gallbladder:

There is no gallbladder wall thickening. There is a gallbladder
polyp measuring approximately 0.5 cm. Per consensus statement,
gallbladder polyps less than 6 mm are usually benign not requiring
follow-up. There is no pericholecystic free fluid. There are no
gallstones. The sonographic Murphy sign is negative.

Common bile duct:

Diameter: 0.5 cm

Liver:

Diffuse increased echogenicity with slightly heterogeneous liver.
Appearance typically secondary to fatty infiltration. Fibrosis
secondary consideration. No secondary findings of cirrhosis noted.
No focal hepatic lesion or intrahepatic biliary duct dilatation.
Portal vein is patent on color Doppler imaging with normal direction
of blood flow towards the liver.
IMPRESSION: 1. No acute sonographic abnormality detected. There is no
sonographic evidence of cholelithiasis.
2. Diffuse increased echogenicity with slightly heterogeneous liver.
Appearance typically secondary to fatty infiltration. Fibrosis
secondary consideration. No secondary findings of cirrhosis noted.
No focal hepatic lesion or intrahepatic biliary duct dilatation.
